# Patient Record
Sex: Female | Born: 1990 | Race: Black or African American | Hispanic: No | Marital: Single | Smoking: Never smoker
Health system: Southern US, Community
[De-identification: ages and names within clinical notes are randomized; demographics above are authoritative.]

## PROBLEM LIST (undated history)

## (undated) DIAGNOSIS — F329 Major depressive disorder, single episode, unspecified: Secondary | ICD-10-CM

## (undated) DIAGNOSIS — N809 Endometriosis, unspecified: Secondary | ICD-10-CM

## (undated) DIAGNOSIS — A599 Trichomoniasis, unspecified: Secondary | ICD-10-CM

## (undated) DIAGNOSIS — F419 Anxiety disorder, unspecified: Secondary | ICD-10-CM

## (undated) DIAGNOSIS — J45909 Unspecified asthma, uncomplicated: Secondary | ICD-10-CM

## (undated) DIAGNOSIS — F32A Depression, unspecified: Secondary | ICD-10-CM

## (undated) HISTORY — DX: Unspecified asthma, uncomplicated: J45.909

## (undated) HISTORY — PX: WISDOM TOOTH EXTRACTION: SHX21

## (undated) HISTORY — DX: Anxiety disorder, unspecified: F41.9

## (undated) HISTORY — DX: Major depressive disorder, single episode, unspecified: F32.9

## (undated) HISTORY — DX: Depression, unspecified: F32.A

## (undated) HISTORY — PX: TONSILLECTOMY AND ADENOIDECTOMY: SUR1326

## (undated) HISTORY — DX: Trichomoniasis, unspecified: A59.9

---

## 2016-05-19 ENCOUNTER — Encounter (HOSPITAL_COMMUNITY): Payer: Self-pay | Admitting: Emergency Medicine

## 2016-05-19 ENCOUNTER — Ambulatory Visit (HOSPITAL_COMMUNITY)
Admission: EM | Admit: 2016-05-19 | Discharge: 2016-05-19 | Disposition: A | Discharge status: Home / Self Care | Payer: BLUE CROSS/BLUE SHIELD | Attending: Family Medicine | Admitting: Family Medicine

## 2016-05-19 DIAGNOSIS — A599 Trichomoniasis, unspecified: Secondary | ICD-10-CM | POA: Diagnosis not present

## 2016-05-19 DIAGNOSIS — Z888 Allergy status to other drugs, medicaments and biological substances status: Secondary | ICD-10-CM | POA: Insufficient documentation

## 2016-05-19 DIAGNOSIS — Z91040 Latex allergy status: Secondary | ICD-10-CM | POA: Diagnosis not present

## 2016-05-19 DIAGNOSIS — N760 Acute vaginitis: Secondary | ICD-10-CM | POA: Insufficient documentation

## 2016-05-19 DIAGNOSIS — N898 Other specified noninflammatory disorders of vagina: Secondary | ICD-10-CM | POA: Diagnosis present

## 2016-05-19 DIAGNOSIS — Z880 Allergy status to penicillin: Secondary | ICD-10-CM | POA: Diagnosis not present

## 2016-05-19 HISTORY — DX: Endometriosis, unspecified: N80.9

## 2016-05-19 MED ORDER — METRONIDAZOLE 500 MG PO TABS
2000.0000 mg | ORAL_TABLET | Freq: Once | ORAL | 1 refills | Status: AC
Start: 2016-05-19 — End: 2016-05-19

## 2016-05-19 NOTE — Discharge Instructions (Signed)
We are treating you for trichomoniasis and will follow the results of your labs for other causes of your symptoms. Please have your partner tested and treated as well. If these are abnormal we will call you with those results and call in the appropriate treatment. If you experience worsening abdominal pain, nausea, vomiting or fever, please proceed to medical care.

## 2016-05-19 NOTE — ED Provider Notes (Signed)
[facility]    CSN: [id number] Arrival date & time: 05/19/16  1308  First Provider Contact:  First MD Initiated Contact with Patient 05/19/16 1415    History   Chief Complaint Chief Complaint  Patient presents with  . Vaginal Discharge   HPI Kristin Weber is a 25 y.o. female presenting for vaginal discharge and irritation. She reports malodorous discharge x3 days since the conclusion of LMP associated with irritation consistent with a case of trich which was diagnosed and treated ~ 1.5 years ago. She has tried no interventions. She's sexually active with a single female partner who is exhibiting no symptoms. She's also had chlamydia treated in the past. Denies fevers, abd pain, N/V, dysuria, change in urination. LMP 8/1. No recent abx.   Past Medical History:  Diagnosis Date  . Endometriosis     There are no active problems to display for this patient.  No past surgical history on file.  OB History    No data available     Home Medications    Prior to Admission medications   Medication Sig Start Date End Date Taking? Authorizing Provider  metroNIDAZOLE (FLAGYL) 500 MG tablet Take 4 tablets (2,000 mg total) by mouth once. 05/19/16 05/19/16  Tyrone Nine, MD   Family History No family history on file.  Social History Social History  Substance Use Topics  . Smoking status: Never Smoker  . Smokeless tobacco: Never Used  . Alcohol use Yes    Allergies   Amoxicillin; Latex; and Sudafed [pseudoephedrine hcl]   Review of Systems Review of Systems As above  Physical Exam Triage Vital Signs ED Triage Vitals [05/19/16 1417]  Enc Vitals Group     BP 106/70     Pulse Rate (!) 59     Resp 18     Temp 98.2 F (36.8 C)     Temp Source Oral     SpO2 100 %     Weight      Height      Head Circumference      Peak Flow      Pain Score      Pain Loc      Pain Edu?      Excl. in GC?    No data found.   Updated Vital Signs BP 106/70 (BP Location: Left Arm)    Pulse (!) 59   Temp 98.2 F (36.8 C) (Oral)   Resp 18   LMP 05/13/2016   SpO2 100%   Physical Exam  Constitutional: She appears well-developed and well-nourished. No distress.  Eyes: Conjunctivae are normal.  Genitourinary:  Genitourinary Comments: Pelvic: Normal external female genitalia without lesions. Clitoral hood piercing present. Vaginal mucosa and cervix normal without lesions, discharge or bleeding noted on speculum exam. No cervical motion tenderness. Theresa Mulligan, RN present throughout duration of exam.  Skin: No rash noted.   UC Treatments / Results  Labs (all labs ordered are listed, but only abnormal results are displayed) Labs Reviewed  CERVICOVAGINAL ANCILLARY ONLY    EKG  EKG Interpretation None      Radiology No results found.  Procedures Procedures (including critical care time)  Medications Ordered in UC Medications - No data to display   Initial Impression / Assessment and Plan / UC Course  I have reviewed the triage vital signs and the nursing notes.  Pertinent labs & imaging results that were available during my care of the patient were reviewed by me and considered  in my medical decision making (see chart for details).  Final Clinical Impressions(s) / UC Diagnoses   Final diagnoses:  Vaginitis  Trichomoniasis   25 y.o. female with single sexual partner with vaginal irritation and discharge without significant findings on exam. Will empirically treat for trichomonas and await lab results for other treatment.  - Will provide expedited partner therapy and encouraged partner to get tested.  - Follow wet prep results - Declines HIV, RPR  New Prescriptions New Prescriptions   METRONIDAZOLE (FLAGYL) 500 MG TABLET    Take 4 tablets (2,000 mg total) by mouth once.     Tyrone Nineyan B Teira Arcilla, MD 05/19/16 1450

## 2016-05-19 NOTE — ED Triage Notes (Signed)
Patient is convinced she has trich.  Patient had a history of the same and having "the same symptoms".  Patient reports vaginal discharge with a foul odor

## 2016-05-20 LAB — CERVICOVAGINAL ANCILLARY ONLY
CHLAMYDIA, DNA PROBE: NEGATIVE
NEISSERIA GONORRHEA: NEGATIVE
Wet Prep (BD Affirm): POSITIVE — AB

## 2016-05-22 ENCOUNTER — Telehealth: Payer: Self-pay | Admitting: Internal Medicine

## 2016-05-22 ENCOUNTER — Telehealth: Payer: Self-pay | Admitting: Emergency Medicine

## 2016-05-22 MED ORDER — METRONIDAZOLE 500 MG PO TABS: 500.0000 mg | ORAL_TABLET | Freq: Two times a day (BID) | ORAL | 0 refills | Status: DC

## 2016-05-22 MED ORDER — FLUCONAZOLE 150 MG PO TABS: 150.0000 mg | ORAL_TABLET | Freq: Every day | ORAL | 0 refills | Status: DC

## 2016-05-22 NOTE — Telephone Encounter (Signed)
Called pt and notified of recent lab results from visit 8/7 Pt ID'd properly... Reports feeling better and sx have subsided but would like for ust to call in Diflucan just in case since we called in Flagyl... Per Kristin Rasmussenavid Mabe, NP ok to call in... Diflucan sent to [facility] ([address])  Adv pt if sx are not getting better to return  Pt verb understanding Education on safe sex given  Per Dr. Dayton ScrapeMurray,   Clinical staff, please let patient know that test for gardnerella (bacterial vaginosis) was positive.  Rx metronidazole was sent to the pharmacy of record, [facility] [address].  Recheck for further evaluation if symptoms persist. LM

## 2016-05-22 NOTE — Telephone Encounter (Signed)
Clinical staff, please let patient know that test for gardnerella (bacterial vaginosis) was positive.  Rx metronidazole was sent to the pharmacy of record, [facility] at [address].  Recheck for further evaluation if symptoms persist.  LM

## 2016-06-05 ENCOUNTER — Encounter: Payer: Self-pay | Admitting: Internal Medicine

## 2016-06-11 NOTE — Telephone Encounter (Signed)
See phone note from 8/10.

## 2016-07-18 ENCOUNTER — Ambulatory Visit (INDEPENDENT_AMBULATORY_CARE_PROVIDER_SITE_OTHER): Payer: BLUE CROSS/BLUE SHIELD | Admitting: Internal Medicine

## 2016-07-18 ENCOUNTER — Other Ambulatory Visit (INDEPENDENT_AMBULATORY_CARE_PROVIDER_SITE_OTHER): Payer: BLUE CROSS/BLUE SHIELD

## 2016-07-18 ENCOUNTER — Other Ambulatory Visit: Payer: BLUE CROSS/BLUE SHIELD

## 2016-07-18 ENCOUNTER — Encounter: Payer: Self-pay | Admitting: Internal Medicine

## 2016-07-18 VITALS — BP 100/58 | HR 68 | Ht 65.0 in | Wt 128.1 lb

## 2016-07-18 DIAGNOSIS — K921 Melena: Secondary | ICD-10-CM | POA: Diagnosis not present

## 2016-07-18 DIAGNOSIS — R1032 Left lower quadrant pain: Secondary | ICD-10-CM | POA: Diagnosis not present

## 2016-07-18 LAB — CBC WITH DIFFERENTIAL/PLATELET
BASOS ABS: 0 10*3/uL (ref 0.0–0.1)
BASOS PCT: 0.4 % (ref 0.0–3.0)
EOS ABS: 0.1 10*3/uL (ref 0.0–0.7)
Eosinophils Relative: 2.1 % (ref 0.0–5.0)
HCT: 38.8 % (ref 36.0–46.0)
HEMOGLOBIN: 13.2 g/dL (ref 12.0–15.0)
LYMPHS PCT: 38.3 % (ref 12.0–46.0)
Lymphs Abs: 2.5 10*3/uL (ref 0.7–4.0)
MCHC: 33.9 g/dL (ref 30.0–36.0)
MCV: 89.6 fl (ref 78.0–100.0)
MONOS PCT: 7.7 % (ref 3.0–12.0)
Monocytes Absolute: 0.5 10*3/uL (ref 0.1–1.0)
NEUTROS PCT: 51.5 % (ref 43.0–77.0)
Neutro Abs: 3.4 10*3/uL (ref 1.4–7.7)
PLATELETS: 292 10*3/uL (ref 150.0–400.0)
RBC: 4.33 Mil/uL (ref 3.87–5.11)
RDW: 12.9 % (ref 11.5–15.5)
WBC: 6.5 10*3/uL (ref 4.0–10.5)

## 2016-07-18 LAB — HCG, QUANTITATIVE, PREGNANCY: QUANTITATIVE HCG: 0.2 m[IU]/mL

## 2016-07-18 NOTE — Progress Notes (Signed)
Kristin Weber 25 y.o. 29-Aug-1991 [id number]  Assessment & Plan:   Encounter Diagnoses  Name Primary?  . Blood in stool Yes  . LLQ pain     Colonoscopy The risks and benefits as well as alternatives of endoscopic procedure(s) have been discussed and reviewed. All questions answered. The patient agrees to proceed.  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 It seems to be somewhat worse, she will of intermittent passage of blood into the toilet. She will occasionally miss bowel movements. Does not strain too much to stool. She says she's been told she has hemorrhoids. She doesn't notice any protrusion her prolapse symptoms or other symptoms of hemorrhoids and there is no pain in the rectum with defecation. Interestingly overnight she awakened with severe left lower quadrant pain and had some diarrhea that was bloody 2. That has abated. She feels fine this morning. She wonders if she might be pregnant. She says she has a history of endometriosis diagnosed by ultrasound. I don't think she's never had a laparoscopy.  Allergies  Allergen Reactions  . Amoxicillin   . Latex   . Sudafed [Pseudoephedrine Hcl]    Outpatient Medications Prior to Visit  Medication Sig Dispense Refill  . fluconazole (DIFLUCAN) 150 MG tablet Take 1 tablet (150 mg total) by mouth daily. Take additional dose in 3 days if symptoms are not getting better. 2 tablet 0  . metroNIDAZOLE (FLAGYL) 500 MG tablet Take 1 tablet (500 mg total) by mouth 2 (two) times daily. 14 tablet 0   No facility-administered medications prior to visit.    Past Medical History:  Diagnosis Date  . Anxiety   . Asthma   . Depression   . Endometriosis   . Trichimoniasis    Past Surgical History:  Procedure Laterality Date  .  TONSILLECTOMY AND ADENOIDECTOMY     Social History   Social History  . Marital status: Single    Spouse name: N/A  . Number of children: 0  . Years of education: N/A   Occupational History  . [employer]    Social History Main Topics  . Smoking status: Never Smoker  . Smokeless tobacco: Never Used  . Alcohol use Yes     Comment: twice a month  . Drug use: No  . Sexual activity: Not Asked   Other Topics Concern  . 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 Breast cancer Other     PGGM, MGGM  . Ovarian cancer Other     PGGM, MGGM  . Diabetes Maternal Grandmother   . Irritable bowel syndrome Maternal Grandmother   . Diabetes Paternal Grandmother   . Irritable bowel syndrome Paternal Grandmother        Review of Systems As per history of present illness. Also allergies. Some fatigue symptoms anxiety and depressed mood at times. Other review of systems are negative.  Objective:   Physical Exam @BP  (!) 100/58 (BP Location: Right Arm, Patient Position: Sitting, Cuff Size: Normal)   Pulse 68   Ht 5\' 5"  (1.651 m) Comment: height measured without shoes  Wt 128 lb 2 oz (58.1 kg)   LMP 07/13/2016   BMI 21.32 kg/m @  General:  Well-developed, well-nourished and in no acute distress Eyes:  anicteric. ENT:   Mouth and posterior pharynx free of lesions.  Neck:   supple w/o thyromegaly or mass.  Lungs: Clear to auscultation bilaterally. Heart:  S1S2, no rubs, murmurs, gallops. Abdomen:  soft, non-tender, no hepatosplenomegaly, hernia, or mass and BS+.  Rectal:  Patti [place], CMA present.  RP small fleshy tag Some anal spasm ? Mildly tender Slight blood on finger no mass  Anoscopy was performed with the patient in the left lateral decubitus position while a chaperone was present  and revealed no abnormalities but anal spasm limited exam somewhat  Lymph:  no cervical or supraclavicular adenopathy. Extremities:   no edema, cyanosis or clubbing Skin   no rash. Numerous tattoos Neuro:  A&O x 3.  Psych:  appropriate mood and  Affect.   Data Reviewed:

## 2016-07-18 NOTE — Patient Instructions (Signed)
  You have been scheduled for a colonoscopy. Please follow written instructions given to you at your visit today.  Please pick up your prep supplies at the pharmacy . If you use inhalers (even only as needed), please bring them with you on the day of your procedure. Your physician has requested that you go to www.startemmi.com and enter the access code given to you at your visit today. This web site gives a general overview about your procedure. However, you should still follow specific instructions given to you by our office regarding your preparation for the procedure.    Your physician has requested that you go to the basement for lab work before leaving today.     I appreciate the opportunity to care for you. Stan Headarl Gessner, MD, [facility]

## 2016-07-18 NOTE — Progress Notes (Signed)
My chart note to the patient negative pregnancy test normal CBC

## 2016-08-13 ENCOUNTER — Other Ambulatory Visit: Payer: Self-pay | Admitting: Obstetrics & Gynecology

## 2016-08-13 ENCOUNTER — Encounter: Payer: Self-pay | Admitting: Internal Medicine

## 2016-08-14 MED FILL — FLUCONAZOLE 150 MG TABLET: 150 | 1 days supply | Qty: 1 | Fill #0

## 2016-08-26 ENCOUNTER — Ambulatory Visit (AMBULATORY_SURGERY_CENTER): Payer: BLUE CROSS/BLUE SHIELD | Admitting: Internal Medicine

## 2016-08-26 ENCOUNTER — Telehealth: Payer: Self-pay | Admitting: Internal Medicine

## 2016-08-26 ENCOUNTER — Encounter: Payer: Self-pay | Admitting: Internal Medicine

## 2016-08-26 VITALS — BP 106/70 | HR 76 | Temp 97.8°F | Resp 14 | Ht 65.0 in | Wt 128.0 lb

## 2016-08-26 DIAGNOSIS — K648 Other hemorrhoids: Secondary | ICD-10-CM | POA: Diagnosis not present

## 2016-08-26 DIAGNOSIS — K921 Melena: Secondary | ICD-10-CM

## 2016-08-26 MED ORDER — SODIUM CHLORIDE 0.9 % IV SOLN: 500.0000 mL | INTRAVENOUS | Status: AC

## 2016-08-26 MED ORDER — HYDROCORTISONE 2.5 % RE CREA: 1.0000 "application " | TOPICAL_CREAM | Freq: Two times a day (BID) | RECTAL | 1 refills | Status: DC | PRN

## 2016-08-26 NOTE — Op Note (Signed)
[facility] Patient Name: Kristin Weber Procedure Date: 08/26/2016 3:28 PM MRN: [id number] Endoscopist: Iva Boop , MD Age: 25 Referring MD:  Date of Birth: 1991-05-24 Gender: Female Account #: [id number] Procedure:                Colonoscopy Indications:              Hematochezia Medicines:                Propofol per Anesthesia, Monitored Anesthesia Care Procedure:                Pre-Anesthesia Assessment:                           - Prior to the procedure, a History and Physical                            was performed, and patient medications and                            allergies were reviewed. The patient's tolerance of                            previous anesthesia was also reviewed. The risks                            and benefits of the procedure and the sedation                            options and risks were discussed with the patient.                            All questions were answered, and informed consent                            was obtained. Prior Anticoagulants: The patient has                            taken no previous anticoagulant or antiplatelet                            agents. ASA Grade Assessment: I - A normal, healthy                            patient. After reviewing the risks and benefits,                            the patient was deemed in satisfactory condition to                            undergo the procedure.                           After obtaining informed consent, the colonoscope  was passed under direct vision. Throughout the                            procedure, the patient's blood pressure, pulse, and                            oxygen saturations were monitored continuously. The                            Model PCF-H190L [id number](SN#[id number]) scope was introduced                            through the anus and advanced to the the cecum,                            identified by appendiceal orifice  and ileocecal                            valve. The colonoscopy was performed without                            difficulty. The patient tolerated the procedure                            well. The quality of the bowel preparation was                            excellent. The bowel preparation used was Miralax.                            The ileocecal valve, appendiceal orifice, and                            rectum were photographed. Scope In: 3:33:40 PM Scope Out: 3:44:08 PM Scope Withdrawal Time: 0 hours 7 minutes 18 seconds  Total Procedure Duration: 0 hours 10 minutes 28 seconds  Findings:                 The perianal and digital rectal examinations were                            normal.                           Internal hemorrhoids were found during retroflexion.                           The exam was otherwise without abnormality on                            direct and retroflexion views. Complications:            No immediate complications. Estimated Blood Loss:     Estimated blood loss: none. Impression:               - Internal hemorrhoids.                           -  The examination was otherwise normal on direct                            and retroflexion views.                           - No specimens collected. Recommendation:           - Patient has a contact number available for                            emergencies. The signs and symptoms of potential                            delayed complications were discussed with the                            patient. Return to normal activities tomorrow.                            Written discharge instructions were provided to the                            patient.                           - Continue present medications.                           - High fiber diet.                           - No recommendation at this time regarding repeat                            colonoscopy due to young age.                            -                           BENEFIBER 2 TBSP DAILY                           HYDROCORTISONE CREAM PRN HEMORRHOID BLEEDING                           IF THIS FAILS CONSIDER BANDING Iva Booparl E Harla Mensch, MD 08/26/2016 3:52:46 PM This report has been signed electronically.

## 2016-08-26 NOTE — Telephone Encounter (Signed)
Spoke with pt and she states she has a "chest bar" and "vaginal piercing."  They are new piercings and she doesn't want to remove them and isn't sure that she'd be able to.  I spoke with Dr. Leone PayorGessner who states it is ok to keep piercings in.

## 2016-08-26 NOTE — Progress Notes (Signed)
Report given to PACU RN, vss 

## 2016-08-26 NOTE — Patient Instructions (Addendum)
It looks like the bleeding is coming from hemorrhoids.  I recommend you take 2 tablespoons of Benefiber every day and treat them with hydrocortisone cream.  If this fails to relieve all/most of symptoms then we may consider hemorrhoid banding if you are interested.  I appreciate the opportunity to care for you. Iva Booparl E. Gessner, MD, FACG     YOU HAD AN ENDOSCOPIC PROCEDURE TODAY AT THE [facility]:   Refer to the procedure report that was given to you for any specific questions about what was found during the examination.  If the procedure report does not answer your questions, please call your gastroenterologist to clarify.  If you requested that your care partner not be given the details of your procedure findings, then the procedure report has been included in a sealed envelope for you to review at your convenience later.  YOU SHOULD EXPECT: Some feelings of bloating in the abdomen. Passage of more gas than usual.  Walking can help get rid of the air that was put into your GI tract during the procedure and reduce the bloating. If you had a lower endoscopy (such as a colonoscopy or flexible sigmoidoscopy) you may notice spotting of blood in your stool or on the toilet paper. If you underwent a bowel prep for your procedure, you may not have a normal bowel movement for a few days.  Please Note:  You might notice some irritation and congestion in your nose or some drainage.  This is from the oxygen used during your procedure.  There is no need for concern and it should clear up in a day or so.  SYMPTOMS TO REPORT IMMEDIATELY:   Following lower endoscopy (colonoscopy or flexible sigmoidoscopy):  Excessive amounts of blood in the stool  Significant tenderness or worsening of abdominal pains  Swelling of the abdomen that is new, acute  Fever of 100F or higher   Following upper endoscopy (EGD)  Vomiting of blood or coffee ground material  New chest pain or pain under  the shoulder blades  Painful or persistently difficult swallowing  New shortness of breath  Fever of 100F or higher  Black, tarry-looking stools  For urgent or emergent issues, a gastroenterologist can be reached at any hour by calling [phone].   DIET:  We do recommend a small meal at first, but then you may proceed to your regular diet.  Drink plenty of fluids but you should avoid alcoholic beverages for 24 hours.  ACTIVITY:  You should plan to take it easy for the rest of today and you should NOT DRIVE or use heavy machinery until tomorrow (because of the sedation medicines used during the test).    FOLLOW UP: Our staff will call the number listed on your records the next business day following your procedure to check on you and address any questions or concerns that you may have regarding the information given to you following your procedure. If we do not reach you, we will leave a message.  However, if you are feeling well and you are not experiencing any problems, there is no need to return our call.  We will assume that you have returned to your regular daily activities without incident.  If any biopsies were taken you will be contacted by phone or by letter within the next 1-3 weeks.  Please call us at [phone] if you have not heard about the biopsies in 3 weeks.    SIGNATURES/CONFIDENTIALITY: You and/or your care  partner have signed paperwork which will be entered into your electronic medical record.  These signatures attest to the fact that that the information above on your After Visit Summary has been reviewed and is understood.  Full responsibility of the confidentiality of this discharge information lies with you and/or your care-partner.   Handouts were given to your care partner on hemorrhoids, The [employer] Banding System info, and a high fiber diet with liberal fluid intake. Add Benifiber 2 TBSP Daily and rx was sent for prescription strength hydrocortisone cream  to your pharmacy by Dr. Lajean SilviusGreesner. If the cream does not help consider banding of hemorrhoids. You may resume your current medications today. Please call if any questions or concerns.

## 2016-08-26 NOTE — Progress Notes (Signed)
Per Dr. Leone PayorGessner he sent rx for hydrocortisone cream to [facility].  No problems noted in the recovery room. maw

## 2016-08-27 ENCOUNTER — Telehealth: Payer: Self-pay

## 2016-08-27 NOTE — Telephone Encounter (Signed)
Attempted to reach pt. For follow up call following procedure yesterday.   Number pt. Provided is no longer in service.

## 2016-09-15 MED FILL — FLUCONAZOLE 150 MG TABLET: 150 | 1 days supply | Qty: 1 | Fill #1

## 2016-11-03 ENCOUNTER — Ambulatory Visit (HOSPITAL_COMMUNITY)
Admission: EM | Admit: 2016-11-03 | Discharge: 2016-11-03 | Disposition: A | Discharge status: Home / Self Care | Payer: BLUE CROSS/BLUE SHIELD | Attending: Family Medicine | Admitting: Family Medicine

## 2016-11-03 ENCOUNTER — Encounter (HOSPITAL_COMMUNITY): Payer: Self-pay | Admitting: Emergency Medicine

## 2016-11-03 DIAGNOSIS — B373 Candidiasis of vulva and vagina: Secondary | ICD-10-CM | POA: Diagnosis not present

## 2016-11-03 DIAGNOSIS — B3731 Acute candidiasis of vulva and vagina: Secondary | ICD-10-CM

## 2016-11-03 DIAGNOSIS — N898 Other specified noninflammatory disorders of vagina: Secondary | ICD-10-CM | POA: Diagnosis present

## 2016-11-03 MED ORDER — TERCONAZOLE 80 MG VA SUPP: 80.0000 mg | Freq: Every day | VAGINAL | 0 refills | Status: DC

## 2016-11-03 MED ORDER — FLUCONAZOLE 150 MG PO TABS: 150.0000 mg | ORAL_TABLET | Freq: Once | ORAL | 1 refills | Status: AC

## 2016-11-03 NOTE — Discharge Instructions (Signed)
We will call with positive test results and treat as indicated  °

## 2016-11-03 NOTE — ED Provider Notes (Signed)
[facility]    CSN: [id number] Arrival date & time: 11/03/16  1657     History   Chief Complaint Chief Complaint  Patient presents with  . Vaginal Discharge    HPI Kristin Weber is a 26 y.o. female.   The history is provided by the patient.  Vaginal Discharge  Quality:  Green, thick and white Severity:  Mild Onset quality:  Gradual Duration:  1 week Progression:  Worsening Chronicity:  Recurrent (h/o mult yeast infection problems.) Context comment:  Pt in lesbian marriage. Relieved by:  None tried Worsened by:  Nothing Associated symptoms: no dysuria     Past Medical History:  Diagnosis Date  . Anxiety   . Asthma   . Depression   . Endometriosis   . Trichimoniasis     There are no active problems to display for this patient.   Past Surgical History:  Procedure Laterality Date  . TONSILLECTOMY AND ADENOIDECTOMY    . WISDOM TOOTH EXTRACTION      OB History    No data available       Home Medications    Prior to Admission medications   Medication Sig Start Date End Date Taking? Authorizing Provider  metroNIDAZOLE (FLAGYL) 500 MG tablet Take 1 tablet (500 mg total) by mouth 2 (two) times daily. 11/09/16   Linna Hoff, MD  terconazole (TERAZOL 3) 80 MG vaginal suppository Place 1 suppository (80 mg total) vaginally at bedtime. 11/09/16   Linna Hoff, MD    Family History Family History  Problem Relation Age of Onset  . Breast cancer Other     PGGM, MGGM  . Ovarian cancer Other     PGGM, MGGM  . Diabetes Maternal Grandmother   . Irritable bowel syndrome Maternal Grandmother   . Diabetes Paternal Grandmother   . Irritable bowel syndrome Paternal Grandmother   . Colon cancer Paternal Grandfather     Social History Social History  Substance Use Topics  . Smoking status: Never Smoker  . Smokeless tobacco: Never Used  . Alcohol use 0.6 oz/week    1 Glasses of wine per week     Comment: twice a month     Allergies     Amoxicillin; Latex; and Sudafed [pseudoephedrine hcl]   Review of Systems Review of Systems  Constitutional: Negative.   Gastrointestinal: Negative.   Genitourinary: Positive for vaginal discharge. Negative for difficulty urinating, dysuria, frequency and menstrual problem.  All other systems reviewed and are negative.    Physical Exam Triage Vital Signs ED Triage Vitals  Enc Vitals Group     BP 11/03/16 1747 128/72     Pulse Rate 11/03/16 1747 65     Resp 11/03/16 1747 16     Temp 11/03/16 1747 99 F (37.2 C)     Temp Source 11/03/16 1747 Oral     SpO2 11/03/16 1747 100 %     Weight --      Height --      Head Circumference --      Peak Flow --      Pain Score 11/03/16 1749 0     Pain Loc --      Pain Edu? --      Excl. in GC? --    No data found.   Updated Vital Signs BP 128/72 (BP Location: Left Arm)   Pulse 65   Temp 99 F (37.2 C) (Oral)   Resp 16   LMP 10/29/2016 (Exact Date)  SpO2 100%   Visual Acuity Right Eye Distance:   Left Eye Distance:   Bilateral Distance:    Right Eye Near:   Left Eye Near:    Bilateral Near:     Physical Exam  Constitutional: She is oriented to person, place, and time. She appears well-developed and well-nourished. No distress.  Abdominal: Soft. Bowel sounds are normal. She exhibits no distension. There is no tenderness.  Neurological: She is alert and oriented to person, place, and time.  Skin: Skin is warm.     UC Treatments / Results  Labs (all labs ordered are listed, but only abnormal results are displayed) Labs Reviewed  URINE CYTOLOGY ANCILLARY ONLY - Abnormal; Notable for the following:       Result Value   Bacterial vaginitis   (*)    Value: **POSITIVE for Atopobium vaginae POSITIVE for Gardnerella vaginalis**   All other components within normal limits  HIV ANTIBODY (ROUTINE TESTING)  RPR  URINE CYTOLOGY ANCILLARY ONLY    EKG  EKG Interpretation None       Radiology No results  found.  Procedures Procedures (including critical care time)  Medications Ordered in UC Medications - No data to display   Initial Impression / Assessment and Plan / UC Course  I have reviewed the triage vital signs and the nursing notes.  Pertinent labs & imaging results that were available during my care of the patient were reviewed by me and considered in my medical decision making (see chart for details).       Final Clinical Impressions(s) / UC Diagnoses   Final diagnoses:  Yeast vaginitis    New Prescriptions Discharge Medication List as of 11/03/2016  6:33 PM    START taking these medications   Details  fluconazole (DIFLUCAN) 150 MG tablet Take 1 tablet (150 mg total) by mouth once. May repeat in 1 week., Starting Mon 11/03/2016, Print    terconazole (TERAZOL 3) 80 MG vaginal suppository Place 1 suppository (80 mg total) vaginally at bedtime., Starting Mon 11/03/2016, Print         Linna HoffJames D Kasiya Burck, MD 11/12/16 2114

## 2016-11-03 NOTE — ED Triage Notes (Signed)
[facility] a complaint of a vaginal discharge x 1 week. The patient denied any low back pain, abdominal pain or dysuria.

## 2016-11-04 LAB — URINE CYTOLOGY ANCILLARY ONLY
CHLAMYDIA, DNA PROBE: NEGATIVE
Neisseria Gonorrhea: NEGATIVE
TRICH (WINDOWPATH): NEGATIVE

## 2016-11-04 LAB — RPR: RPR: NONREACTIVE

## 2016-11-04 LAB — HIV ANTIBODY (ROUTINE TESTING W REFLEX): HIV Screen 4th Generation wRfx: NONREACTIVE

## 2016-11-04 MED FILL — FLUCONAZOLE 150 MG TABLET: 150 | 1 days supply | Qty: 1 | Fill #0

## 2016-11-04 MED FILL — TERCONAZOLE 80 MG SUPP: 80 | 3 days supply | Qty: 3 | Fill #0

## 2016-11-05 LAB — URINE CYTOLOGY ANCILLARY ONLY
Bacterial vaginitis: POSITIVE — AB
Candida vaginitis: NEGATIVE

## 2016-11-09 ENCOUNTER — Telehealth (HOSPITAL_COMMUNITY): Payer: Self-pay | Admitting: Emergency Medicine

## 2016-11-09 MED ORDER — METRONIDAZOLE 500 MG PO TABS: 500.0000 mg | ORAL_TABLET | Freq: Two times a day (BID) | ORAL | 1 refills | Status: AC

## 2016-11-09 MED ORDER — TERCONAZOLE 80 MG VA SUPP: 80.0000 mg | Freq: Every day | VAGINAL | 0 refills | Status: AC

## 2016-11-09 NOTE — Telephone Encounter (Signed)
Patient called and requested prescriptions be re-sent to [facility] on [address].

## 2016-12-12 MED FILL — FLUCONAZOLE 150 MG TABLET: 150 | 3 days supply | Qty: 2 | Fill #0

## 2017-01-12 ENCOUNTER — Other Ambulatory Visit: Payer: Self-pay | Admitting: Obstetrics & Gynecology

## 2017-01-12 MED FILL — metFORMIN HCL 500 MG TABS: 500 | 21 days supply | Qty: 42 | Fill #0

## 2017-02-03 MED FILL — FLUCONAZOLE 150 MG TABLET: 150 | 6 days supply | Qty: 2 | Fill #0

## 2017-02-17 MED FILL — PRENA1 PEARL SOFTGEL: 30-1.4-200 | 21 days supply | Qty: 21 | Fill #0

## 2017-02-18 MED FILL — MEDROXYPROGESTERONE 10 MG T: 10 | 10 days supply | Qty: 10 | Fill #0

## 2017-03-20 MED FILL — CLOMIPHENE CITRATE 50 MG TA: 50 | 5 days supply | Qty: 5 | Fill #0

## 2017-04-16 MED FILL — CLOMIPHENE CITRATE 50 MG TA: 50 | 5 days supply | Qty: 5 | Fill #1

## 2017-04-22 ENCOUNTER — Emergency Department
Admission: EM | Admit: 2017-04-22 | Discharge: 2017-04-22 | Disposition: A | Discharge status: Home / Self Care | Payer: BLUE CROSS/BLUE SHIELD | Attending: Emergency Medicine | Admitting: Emergency Medicine

## 2017-04-22 ENCOUNTER — Emergency Department: Payer: BLUE CROSS/BLUE SHIELD

## 2017-04-22 DIAGNOSIS — K2900 Acute gastritis without bleeding: Secondary | ICD-10-CM

## 2017-04-22 DIAGNOSIS — J45909 Unspecified asthma, uncomplicated: Secondary | ICD-10-CM | POA: Insufficient documentation

## 2017-04-22 DIAGNOSIS — Z91040 Latex allergy status: Secondary | ICD-10-CM | POA: Diagnosis not present

## 2017-04-22 DIAGNOSIS — K219 Gastro-esophageal reflux disease without esophagitis: Secondary | ICD-10-CM | POA: Diagnosis not present

## 2017-04-22 DIAGNOSIS — R079 Chest pain, unspecified: Secondary | ICD-10-CM | POA: Diagnosis present

## 2017-04-22 LAB — CBC
HEMATOCRIT: 38.4 % (ref 35.0–47.0)
Hemoglobin: 13.1 g/dL (ref 12.0–16.0)
MCH: 30.8 pg (ref 26.0–34.0)
MCHC: 34.2 g/dL (ref 32.0–36.0)
MCV: 90.1 fL (ref 80.0–100.0)
PLATELETS: 256 10*3/uL (ref 150–440)
RBC: 4.26 MIL/uL (ref 3.80–5.20)
RDW: 12.7 % (ref 11.5–14.5)
WBC: 6.7 10*3/uL (ref 3.6–11.0)

## 2017-04-22 LAB — BASIC METABOLIC PANEL
Anion gap: 10 (ref 5–15)
BUN: 7 mg/dL (ref 6–20)
CO2: 25 mmol/L (ref 22–32)
Calcium: 9.4 mg/dL (ref 8.9–10.3)
Chloride: 104 mmol/L (ref 101–111)
Creatinine, Ser: 0.74 mg/dL (ref 0.44–1.00)
Glucose, Bld: 82 mg/dL (ref 65–99)
POTASSIUM: 3.9 mmol/L (ref 3.5–5.1)
SODIUM: 139 mmol/L (ref 135–145)

## 2017-04-22 LAB — HCG, QUANTITATIVE, PREGNANCY

## 2017-04-22 LAB — URINALYSIS, COMPLETE (UACMP) WITH MICROSCOPIC
Bacteria, UA: NONE SEEN
Bilirubin Urine: NEGATIVE
Glucose, UA: NEGATIVE mg/dL
Hgb urine dipstick: NEGATIVE
Ketones, ur: 5 mg/dL — AB
LEUKOCYTES UA: NEGATIVE
Nitrite: NEGATIVE
PH: 7 (ref 5.0–8.0)
Protein, ur: NEGATIVE mg/dL
RBC / HPF: NONE SEEN RBC/hpf (ref 0–5)
SPECIFIC GRAVITY, URINE: 1.015 (ref 1.005–1.030)

## 2017-04-22 LAB — POCT PREGNANCY, URINE: PREG TEST UR: NEGATIVE

## 2017-04-22 LAB — TROPONIN I: Troponin I: <0.03 ng/mL (ref <0.03)

## 2017-04-22 MED ORDER — DICYCLOMINE HCL 10 MG PO CAPS: 10.0000 mg | ORAL_CAPSULE | Freq: Once | ORAL | Status: DC

## 2017-04-22 MED ORDER — SUCRALFATE 1 G PO TABS: 1.0000 g | ORAL_TABLET | Freq: Once | ORAL | Status: DC

## 2017-04-22 MED ORDER — GI COCKTAIL ~~LOC~~
30.0000 mL | Freq: Once | ORAL | Status: AC
  Administered 2017-04-22: 30 mL via ORAL
  Filled 2017-04-22: qty 30

## 2017-04-22 MED ORDER — FAMOTIDINE 40 MG PO TABS: 40.0000 mg | ORAL_TABLET | Freq: Every evening | ORAL | 0 refills | Status: AC

## 2017-04-22 MED ORDER — FAMOTIDINE 20 MG PO TABS
40.0000 mg | ORAL_TABLET | Freq: Once | ORAL | Status: AC
  Administered 2017-04-22: 40 mg via ORAL
  Filled 2017-04-22: qty 2

## 2017-04-22 NOTE — ED Provider Notes (Signed)
[facility] Emergency Department Provider Note  ____________________________________________   First MD Initiated Contact with Patient 04/22/17 1406     (approximate)  I have reviewed the triage vital signs and the nursing notes.   HISTORY  Chief Complaint No chief complaint on file.   HPI Kristin Weber is a 26 y.o. female who is presenting to the emergency department today with 4 out of 10 chest pain which she describes as a burning sensation. She says it has been constant over the past 24 hours worse when she lies back or burps. She is also complaining of left upper quadrant abdominal pain. Says that she has been taking Klonopin over the past several days in an effort to ovulate to become pregnant. Says that she has been nauseous but has not vomited. Says that food also makes the pain worse that the pain radiates through to the back. Does not report any shortness of breath. Does not report any diaphoresis. Says that she had tried Tums at home without any relief.   Past Medical History:  Diagnosis Date  . Anxiety   . Asthma   . Depression   . Endometriosis   . Trichimoniasis     There are no active problems to display for this patient.   Past Surgical History:  Procedure Laterality Date  . TONSILLECTOMY AND ADENOIDECTOMY    . WISDOM TOOTH EXTRACTION      Prior to Admission medications   Medication Sig Start Date End Date Taking? Authorizing Provider  metroNIDAZOLE (FLAGYL) 500 MG tablet Take 1 tablet (500 mg total) by mouth 2 (two) times daily. 11/09/16   Linna HoffKindl, James D, MD  terconazole (TERAZOL 3) 80 MG vaginal suppository Place 1 suppository (80 mg total) vaginally at bedtime. 11/09/16   Linna HoffKindl, James D, MD    Allergies Amoxicillin; Latex; and Sudafed [pseudoephedrine hcl]  Family History  Problem Relation Age of Onset  . Breast cancer Other        PGGM, MGGM  . Ovarian cancer Other        PGGM, MGGM  . Diabetes Maternal Grandmother     . Irritable bowel syndrome Maternal Grandmother   . Diabetes Paternal Grandmother   . Irritable bowel syndrome Paternal Grandmother   . Colon cancer Paternal Grandfather     Social History Social History  Substance Use Topics  . Smoking status: Never Smoker  . Smokeless tobacco: Never Used  . Alcohol use 0.6 oz/week    1 Glasses of wine per week     Comment: twice a month    Review of Systems  Constitutional: No fever/chills Eyes: No visual changes. ENT: No sore throat. Cardiovascular:as above Respiratory: Denies shortness of breath. Gastrointestinal:  no vomiting.  No diarrhea.  No constipation. Genitourinary: Negative for dysuria. Musculoskeletal: Negative for back pain. Skin: Negative for rash. Neurological: Negative for headaches, focal weakness or numbness.   ____________________________________________   PHYSICAL EXAM:  VITAL SIGNS: ED Triage Vitals  Enc Vitals Group     BP 04/22/17 1417 118/70     Pulse Rate 04/22/17 1417 63     Resp 04/22/17 1417 18     Temp 04/22/17 1417 98 F (36.7 C)     Temp Source 04/22/17 1417 Oral     SpO2 04/22/17 1417 100 %     Weight 04/22/17 1223 150 lb (68 kg)     Height --      Head Circumference --      Peak Flow --  Pain Score 04/22/17 1238 5     Pain Loc --      Pain Edu? --      Excl. in GC? --     Constitutional: Alert and oriented. Well appearing and in no acute distress. Eyes: Conjunctivae are normal.  Head: Atraumatic. Nose: No congestion/rhinnorhea. Mouth/Throat: Mucous membranes are moist.  Neck: No stridor.   Cardiovascular: Normal rate, regular rhythm. Grossly normal heart sounds.  Respiratory: Normal respiratory effort.  No retractions. Lungs CTAB. Gastrointestinal: Soft With moderate left upper quadrant tenderness to palpation. No distention.  Musculoskeletal: No lower extremity tenderness nor edema.  No joint effusions. Neurologic:  Normal speech and language. No gross focal neurologic deficits  are appreciated. Skin:  Skin is warm, dry and intact. No rash noted. Psychiatric: Mood and affect are normal. Speech and behavior are normal.  ____________________________________________   LABS (all labs ordered are listed, but only abnormal results are displayed)  Labs Reviewed  URINALYSIS, COMPLETE (UACMP) WITH MICROSCOPIC - Abnormal; Notable for the following:       Result Value   Color, Urine YELLOW (*)    APPearance CLEAR (*)    Ketones, ur 5 (*)    Squamous Epithelial / LPF 0-5 (*)    All other components within normal limits  BASIC METABOLIC PANEL  CBC  TROPONIN I  HCG, QUANTITATIVE, PREGNANCY  POCT PREGNANCY, URINE  POC URINE PREG, ED   ____________________________________________  EKG  ED ECG REPORT I, Arelia Longest, the attending physician, personally viewed and interpreted this ECG.   Date: 04/22/2017  EKG Time: 1225  Rate: 69  Rhythm: normal sinus rhythm  Axis: Normal  Intervals:none  ST&T Change: Biphasic T waves in 2, aVF, V3 through V6. No previous for comparison. No ST elevation or depression.  ____________________________________________  RADIOLOGY  No acute finding on chest x-ray ____________________________________________   PROCEDURES  Procedure(s) performed: None  Procedures  Critical Care performed: No  ____________________________________________   INITIAL IMPRESSION / ASSESSMENT AND PLAN / ED COURSE  Pertinent labs & imaging results that were available during my care of the patient were reviewed by me and considered in my medical decision making (see chart for details).  ----------------------------------------- 3:06 PM on 04/22/2017 -----------------------------------------  Patient initially given a GI cocktail for presumed gastritis and esophagitis but says that it feels like her pain is now worse. She is rocking back-and-forth bed holding her chest. Given Pepcid at this time.      ----------------------------------------- 3:38 PM on 04/22/2017 -----------------------------------------  After Pepcid the patient is pain-free. I palpated her abdomen and she is nontender at this time. She'll be discharged with a prescription for Pepcid. She will hold her Clomid and follow-up with her OB/GYN.  ____________________________________________   FINAL CLINICAL IMPRESSION(S) / ED DIAGNOSES  Gastritis.  gerd    NEW MEDICATIONS STARTED DURING THIS VISIT:  New Prescriptions   No medications on file     Note:  This document was prepared using Dragon voice recognition software and may include unintentional dictation errors.     Myrna Blazer, MD 04/22/17 1539

## 2017-04-22 NOTE — ED Triage Notes (Signed)
PAtient presents with central chest pain that radiates to her abdomen that started yesterday. Off and on sharp pains with a constant pressure present. Has not been able to eat in days per patient.

## 2017-04-22 NOTE — ED Notes (Signed)
Info prior to 1219 by this Clinical research associatewriter entered in error.

## 2017-04-24 ENCOUNTER — Emergency Department: Payer: BLUE CROSS/BLUE SHIELD

## 2017-04-24 ENCOUNTER — Other Ambulatory Visit: Payer: Self-pay

## 2017-04-24 ENCOUNTER — Emergency Department
Admission: EM | Admit: 2017-04-24 | Discharge: 2017-04-24 | Disposition: A | Discharge status: Home / Self Care | Payer: BLUE CROSS/BLUE SHIELD | Attending: Emergency Medicine | Admitting: Emergency Medicine

## 2017-04-24 DIAGNOSIS — J45909 Unspecified asthma, uncomplicated: Secondary | ICD-10-CM | POA: Diagnosis not present

## 2017-04-24 DIAGNOSIS — K5900 Constipation, unspecified: Secondary | ICD-10-CM | POA: Diagnosis not present

## 2017-04-24 DIAGNOSIS — Z79899 Other long term (current) drug therapy: Secondary | ICD-10-CM | POA: Diagnosis not present

## 2017-04-24 DIAGNOSIS — K2970 Gastritis, unspecified, without bleeding: Secondary | ICD-10-CM

## 2017-04-24 DIAGNOSIS — K5909 Other constipation: Secondary | ICD-10-CM

## 2017-04-24 DIAGNOSIS — R1013 Epigastric pain: Secondary | ICD-10-CM | POA: Diagnosis present

## 2017-04-24 LAB — URINALYSIS, COMPLETE (UACMP) WITH MICROSCOPIC
Bacteria, UA: NONE SEEN
Bilirubin Urine: NEGATIVE
GLUCOSE, UA: NEGATIVE mg/dL
Hgb urine dipstick: NEGATIVE
KETONES UR: NEGATIVE mg/dL
Leukocytes, UA: NEGATIVE
Nitrite: NEGATIVE
PH: 6 (ref 5.0–8.0)
Protein, ur: NEGATIVE mg/dL
RBC / HPF: NONE SEEN RBC/hpf (ref 0–5)
SPECIFIC GRAVITY, URINE: 1.023 (ref 1.005–1.030)

## 2017-04-24 LAB — POCT PREGNANCY, URINE: Preg Test, Ur: NEGATIVE

## 2017-04-24 LAB — CBC
HCT: 38.4 % (ref 35.0–47.0)
Hemoglobin: 12.8 g/dL (ref 12.0–16.0)
MCH: 30 pg (ref 26.0–34.0)
MCHC: 33.4 g/dL (ref 32.0–36.0)
MCV: 89.9 fL (ref 80.0–100.0)
PLATELETS: 251 10*3/uL (ref 150–440)
RBC: 4.26 MIL/uL (ref 3.80–5.20)
RDW: 12.7 % (ref 11.5–14.5)
WBC: 5.3 10*3/uL (ref 3.6–11.0)

## 2017-04-24 LAB — COMPREHENSIVE METABOLIC PANEL
ALK PHOS: 40 U/L (ref 38–126)
ALT: 19 U/L (ref 14–54)
AST: 25 U/L (ref 15–41)
Albumin: 4.3 g/dL (ref 3.5–5.0)
Anion gap: 7 (ref 5–15)
BUN: 13 mg/dL (ref 6–20)
CALCIUM: 9.2 mg/dL (ref 8.9–10.3)
CHLORIDE: 106 mmol/L (ref 101–111)
CO2: 25 mmol/L (ref 22–32)
CREATININE: 0.78 mg/dL (ref 0.44–1.00)
GFR calc non Af Amer: >60 mL/min (ref >60)
GLUCOSE: 92 mg/dL (ref 65–99)
Potassium: 3.7 mmol/L (ref 3.5–5.1)
SODIUM: 138 mmol/L (ref 135–145)
Total Bilirubin: 1 mg/dL (ref 0.3–1.2)
Total Protein: 7.5 g/dL (ref 6.5–8.1)

## 2017-04-24 LAB — TROPONIN I: Troponin I: <0.03 ng/mL (ref <0.03)

## 2017-04-24 LAB — LIPASE, BLOOD: LIPASE: 29 U/L (ref 11–51)

## 2017-04-24 MED ORDER — ESOMEPRAZOLE MAGNESIUM 40 MG PO CPDR: 40.0000 mg | DELAYED_RELEASE_CAPSULE | Freq: Every day | ORAL | 0 refills | Status: AC

## 2017-04-24 MED ORDER — POLYETHYLENE GLYCOL 3350 17 G PO PACK: 17.0000 g | PACK | Freq: Every day | ORAL | 0 refills | Status: AC

## 2017-04-24 MED ORDER — PANTOPRAZOLE SODIUM 40 MG IV SOLR
40.0000 mg | Freq: Once | INTRAVENOUS | Status: AC
  Administered 2017-04-24: 40 mg via INTRAVENOUS
  Filled 2017-04-24: qty 40

## 2017-04-24 MED ORDER — SODIUM CHLORIDE 0.9 % IV BOLUS (SEPSIS)
1000.0000 mL | Freq: Once | INTRAVENOUS | Status: AC
  Administered 2017-04-24: 1000 mL via INTRAVENOUS

## 2017-04-24 NOTE — Discharge Instructions (Signed)
Take nexium daily. You can continue pepcid as needed.   Take miralax daily for constipation.   Please call Dr. Marvell FullerGessner's office for appointment. Consider endoscopy if you have persistent abdominal pain.   Return to ER if you have worse abdominal pain, nausea, vomiting, dehydration, fever.

## 2017-04-24 NOTE — ED Provider Notes (Signed)
[facility] Provider Note   CSN: [id number] Arrival date & time: 04/24/17  0901     History   Chief Complaint Chief Complaint  Patient presents with  . Abdominal Pain    HPI Kristin Weber is a 26 y.o. female history of asthma, anxiety, endometriosis, here presenting with epigastric pain, chest pain, nausea, constipation. Patient states that she's been having epigastric pain for the last 3-4 days. States that it's a burning sensation that radiates to her chest. Worse after she eats. Patient also wakes up at night from the pain. Denies any urinary symptoms and denies any lower abdominal pain. She states that she has been constipated and only small stool comes out. Patient was seen in the ED 2 days ago and had unremarkable lab work and was diagnosed with gastritis and sent home with Pepcid. Patient states that she took some Pepcid yesterday but did not help her symptoms. Patient denies any previous abdominal surgeries.   The history is provided by the patient.    Past Medical History:  Diagnosis Date  . Anxiety   . Asthma   . Depression   . Endometriosis   . Trichimoniasis     There are no active problems to display for this patient.   Past Surgical History:  Procedure Laterality Date  . TONSILLECTOMY AND ADENOIDECTOMY    . WISDOM TOOTH EXTRACTION      OB History    No data available       Home Medications    Prior to Admission medications   Medication Sig Start Date End Date Taking? Authorizing Provider  ClomiPHENE Citrate (CLOMID PO) Take 1 tablet by mouth.   Yes [provider]  famotidine (PEPCID) 40 MG tablet Take 1 tablet (40 mg total) by mouth every evening. 04/22/17 04/22/18 Yes Schaevitz, Myra Rude, MD  ibuprofen (ADVIL,MOTRIN) 200 MG tablet Take 200 mg by mouth every 6 (six) hours as needed.   Yes [provider]  loratadine (CLARITIN) 10 MG tablet Take 10 mg by mouth daily.   Yes [provider]  metroNIDAZOLE  (FLAGYL) 500 MG tablet Take 1 tablet (500 mg total) by mouth 2 (two) times daily. Patient not taking: Reported on 04/24/2017 11/09/16   Linna Hoff, MD  terconazole (TERAZOL 3) 80 MG vaginal suppository Place 1 suppository (80 mg total) vaginally at bedtime. Patient not taking: Reported on 04/24/2017 11/09/16   Linna Hoff, MD    Family History Family History  Problem Relation Age of Onset  . Breast cancer Other        PGGM, MGGM  . Ovarian cancer Other        PGGM, MGGM  . Diabetes Maternal Grandmother   . Irritable bowel syndrome Maternal Grandmother   . Diabetes Paternal Grandmother   . Irritable bowel syndrome Paternal Grandmother   . Colon cancer Paternal Grandfather     Social History Social History  Substance Use Topics  . Smoking status: Never Smoker  . Smokeless tobacco: Never Used  . Alcohol use 0.6 oz/week    1 Glasses of wine per week     Comment: twice a month     Allergies   Amoxicillin; Sudafed [pseudoephedrine hcl]; and Latex   Review of Systems Review of Systems  Gastrointestinal: Positive for abdominal pain.  All other systems reviewed and are negative.    Physical Exam Updated Vital Signs BP 106/67   Pulse 66   Temp 98.4 F (36.9 C) (Oral)   Resp 18  Wt 68 kg (150 lb)   LMP 04/16/2017 (Exact Date) Comment: negative preg test  SpO2 100%   BMI 24.96 kg/m   Physical Exam  Constitutional: She is oriented to person, place, and time. She appears well-developed.  Anxious, slightly uncomfortable   HENT:  Head: Normocephalic.  Mouth/Throat: Oropharynx is clear and moist.  Eyes: Pupils are equal, round, and reactive to light. EOM are normal.  Neck: Normal range of motion. Neck supple.  Cardiovascular: Normal rate, regular rhythm and normal heart sounds.   Pulmonary/Chest: Effort normal and breath sounds normal. No respiratory distress. She has no wheezes. She has no rales.  Abdominal: Soft. Bowel sounds are normal.  Mild epigastric  tenderness, mild RUQ tenderness, no murphy sign   Musculoskeletal: Normal range of motion.  Neurological: She is alert and oriented to person, place, and time. No cranial nerve deficit. Coordination normal.  Skin: Skin is warm.  Psychiatric: She has a normal mood and affect.  Nursing note and vitals reviewed.    ED Treatments / Results  Labs (all labs ordered are listed, but only abnormal results are displayed) Labs Reviewed  URINALYSIS, COMPLETE (UACMP) WITH MICROSCOPIC - Abnormal; Notable for the following:       Result Value   Color, Urine YELLOW (*)    APPearance CLEAR (*)    Squamous Epithelial / LPF 0-5 (*)    All other components within normal limits  LIPASE, BLOOD  COMPREHENSIVE METABOLIC PANEL  CBC  TROPONIN I  POCT PREGNANCY, URINE  POC URINE PREG, ED    EKG  EKG Interpretation  Date/Time:  Friday April 24 2017 09:07:06 EDT Ventricular Rate:  64 PR Interval:  158 QRS Duration: 82 QT Interval:  410 QTC Calculation: 422 R Axis:   26 Text Interpretation:  Normal sinus rhythm Normal ECG When compared with ECG of 22-Apr-2017 12:25, QT has lengthened Confirmed by UNCONFIRMED, DOCTOR ([id number]), editor Fredric Mare, Tammy [phone]) on 04/24/2017 9:47:31 AM       Radiology US Abdomen Complete  Result Date: 04/24/2017 CLINICAL DATA:  Acute generalized abdominal pain, nausea. EXAM: ABDOMEN ULTRASOUND COMPLETE COMPARISON:  None. FINDINGS: Gallbladder: No gallstones or wall thickening visualized. No sonographic Murphy sign noted by sonographer. Common bile duct: Diameter: 2.3 mm which is within normal limits. Liver: No focal lesion identified. Within normal limits in parenchymal echogenicity. IVC: No abnormality visualized. Pancreas: Visualized portion unremarkable. Spleen: Size and appearance within normal limits. Right Kidney: Length: 10.9 cm. Echogenicity within normal limits. No mass or hydronephrosis visualized. Left Kidney: Length: 10.8 cm. Echogenicity within normal limits. No  mass or hydronephrosis visualized. Abdominal aorta: No aneurysm visualized. Other findings: None. IMPRESSION: No abnormality seen in the abdomen. Electronically Signed   By: Lupita Raider, M.D.   On: 04/24/2017 14:12   Dg Abd Acute W/chest  Result Date: 04/24/2017 CLINICAL DATA:  Chest and epigastric pain for the past few days. EXAM: DG ABDOMEN ACUTE W/ 1V CHEST COMPARISON:  Chest x-ray 04/22/2017 FINDINGS: The upright chest x-ray is normal. Two views of the abdomen demonstrate an unremarkable bowel gas pattern. No findings for obstruction or perforation. The soft tissue shadows are maintained. No worrisome calcifications. The bony structures are normal. IMPRESSION: Unremarkable acute abdominal series. Electronically Signed   By: Rudie Meyer M.D.   On: 04/24/2017 13:14    Procedures Procedures (including critical care time)  Medications Ordered in ED Medications  sodium chloride 0.9 % bolus 1,000 mL (1,000 mLs Intravenous New Bag/Given 04/24/17 1221)  pantoprazole (PROTONIX) injection 40  mg (40 mg Intravenous Given 04/24/17 1221)     Initial Impression / Assessment and Plan / ED Course  I have reviewed the triage vital signs and the nursing notes.  Pertinent labs & imaging results that were available during my care of the patient were reviewed by me and considered in my medical decision making (see chart for details).    Kristin Weber is a 26 y.o. female here with epigastric pain, nausea. Likely gastritis vs biliary colic vs constipation. Will repeat labs, CMP, lipase, UA. Will get acute abdominal series, abdominal US. Will give IVF, protonix and reassess.    2:24 PM LFTs nl. UA nl. Xrays showed mild constipation. US showed nl gallbladder and liver. I think likely gastritis and constipation. Will add nexium in addition to pepcid. Will add miralax for constipation. She saw Dr. Leone PayorGessner, GI doctor, before. I told her to follow Weber up with Dr. Leone PayorGessner and consider getting endoscopy for  further evaluation.   Final Clinical Impressions(s) / ED Diagnoses   Final diagnoses:  None    New Prescriptions New Prescriptions   No medications on file     Charlynne PanderYao, Mikinzie Maciejewski Hsienta, MD 04/24/17 1425

## 2017-04-24 NOTE — ED Triage Notes (Signed)
Patient to ER for c/o epigastric/chest pain that started a few days ago. States she was seen here at that time, but did not have CT or US. Patient states pain worsens after eating.

## 2017-04-24 NOTE — ED Notes (Signed)
Patient updated on wait, as well as lab results. Warm blanket given.

## 2017-04-24 NOTE — ED Notes (Signed)
Lab results reviewed. Awaiting room for MD eval.  

## 2018-10-07 IMAGING — US US ABDOMEN COMPLETE
1 series · 14 of 25 positions shown · non-contrast
Comparison: None.

CLINICAL DATA: Acute generalized abdominal pain, nausea.

EXAM:
ABDOMEN ULTRASOUND COMPLETE

[Series 1: us abdomen complete · 0.11mm/px · 14 of 96 slices shown]
[im 1/96]
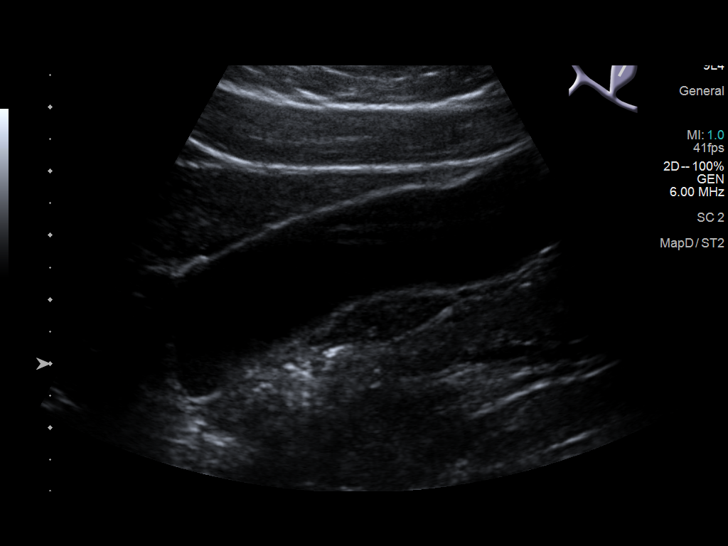
[im 8/96]
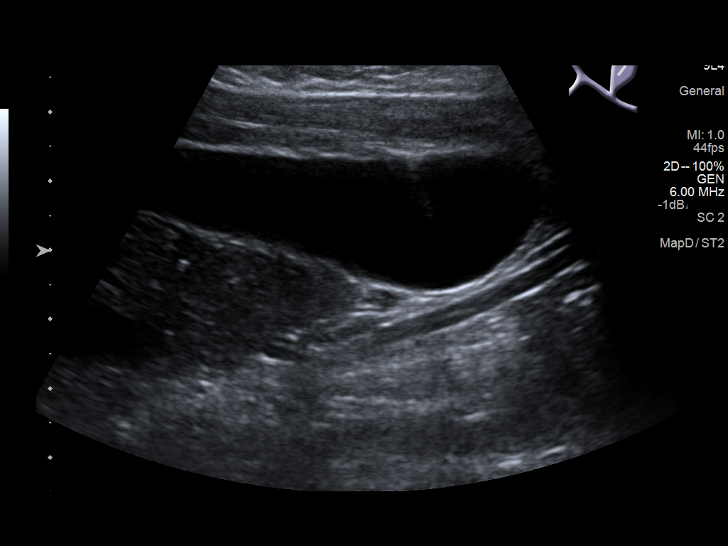
[im 16/96]
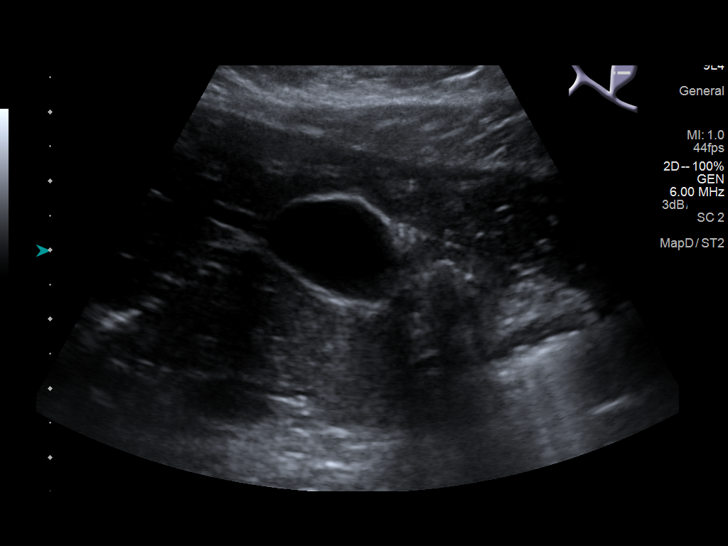
[im 24/96]
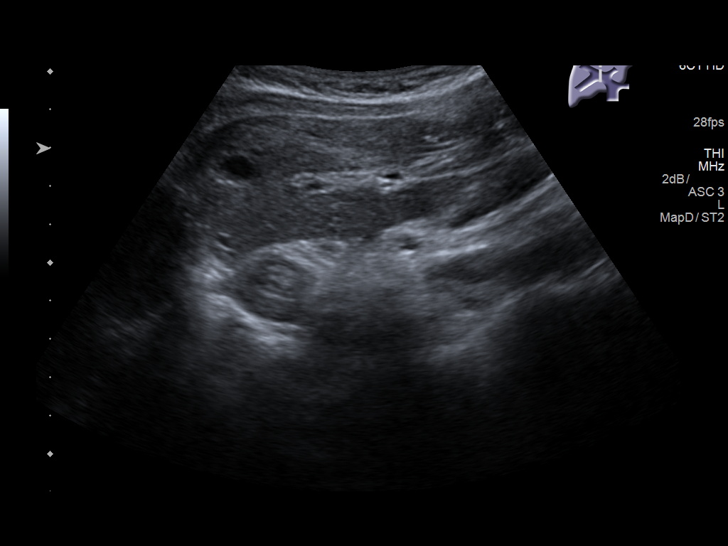
[im 32/96]
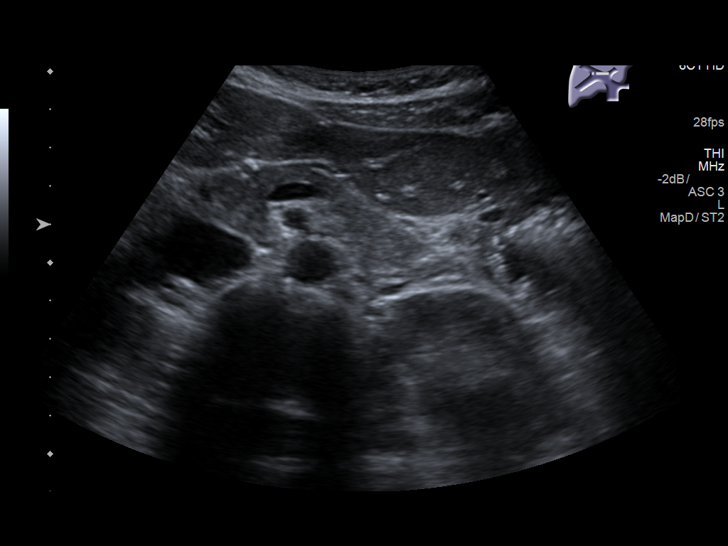
[im 36/96]
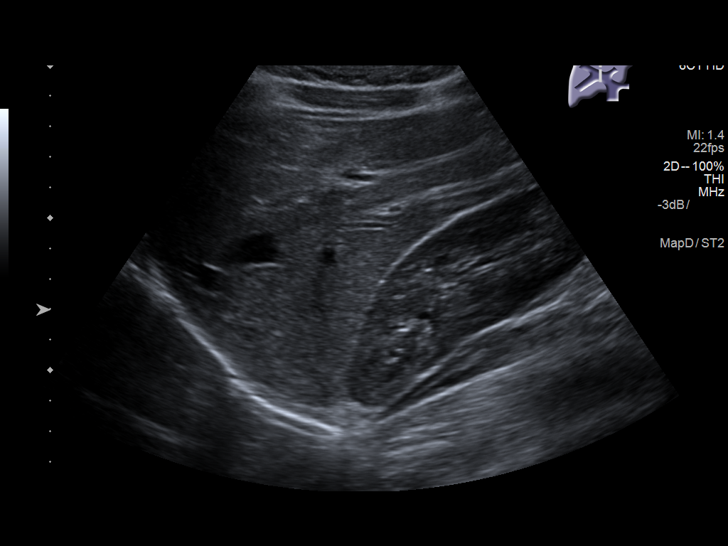
[im 44/96]
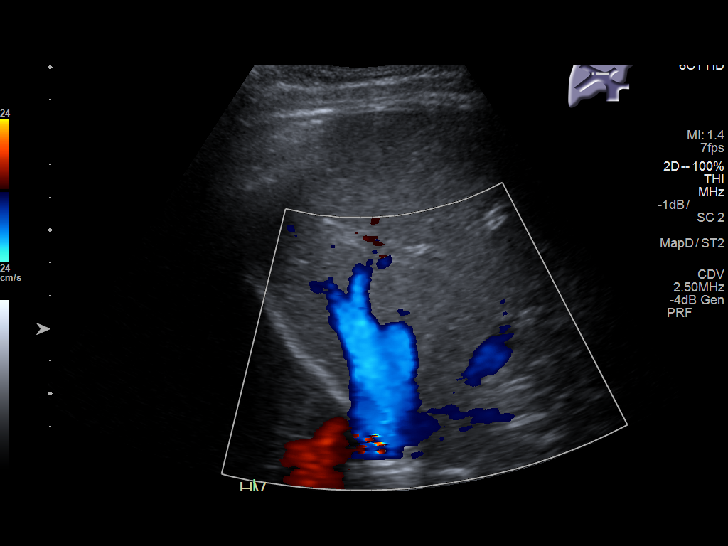
[im 52/96]
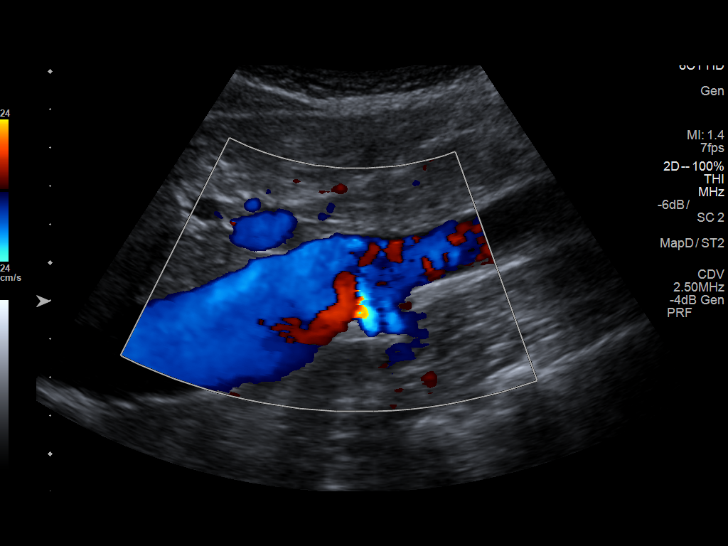
[im 60/96]
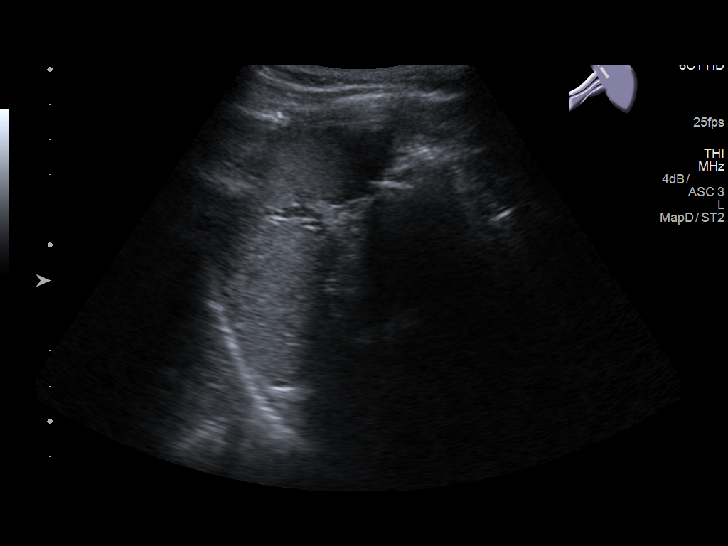
[im 64/96]
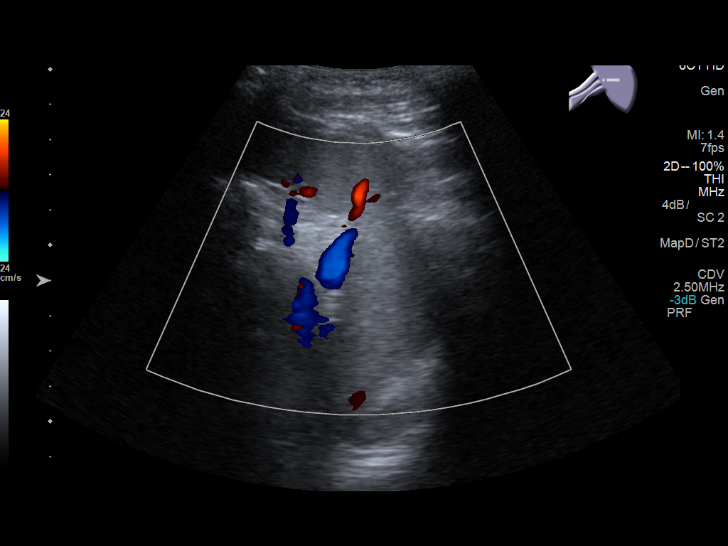
[im 72/96]
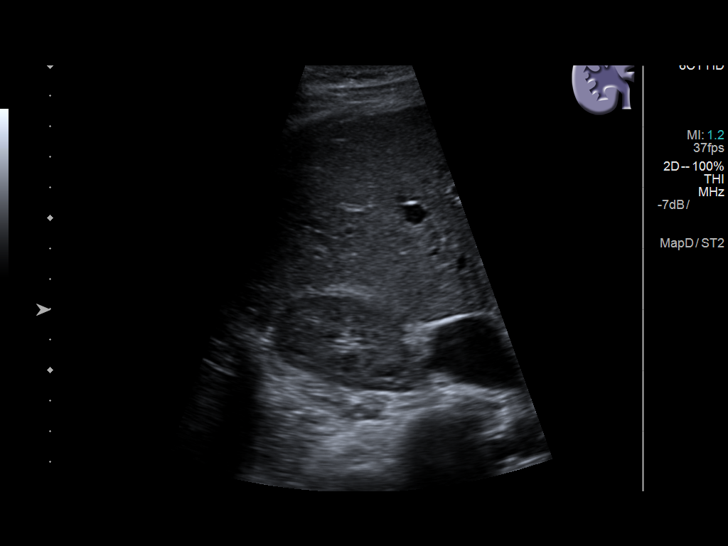
[im 80/96]
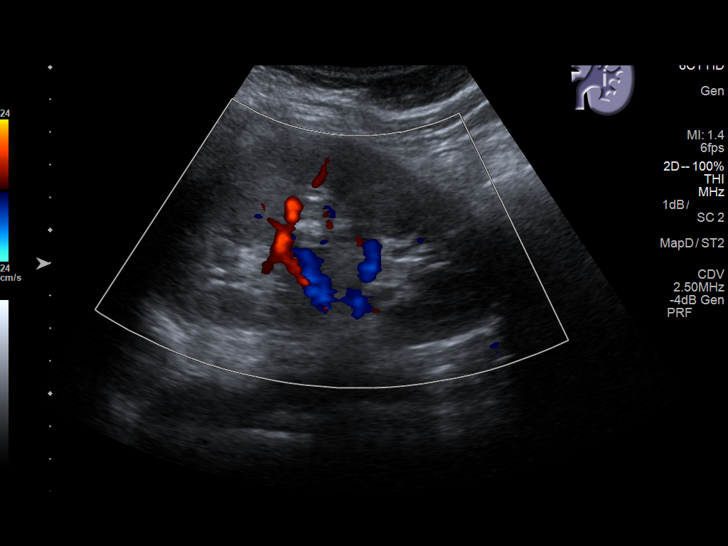
[im 88/96]
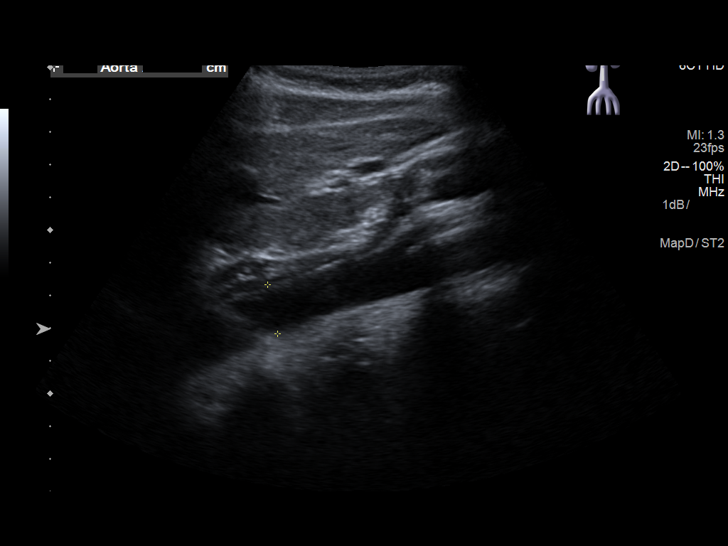
[im 96/96]
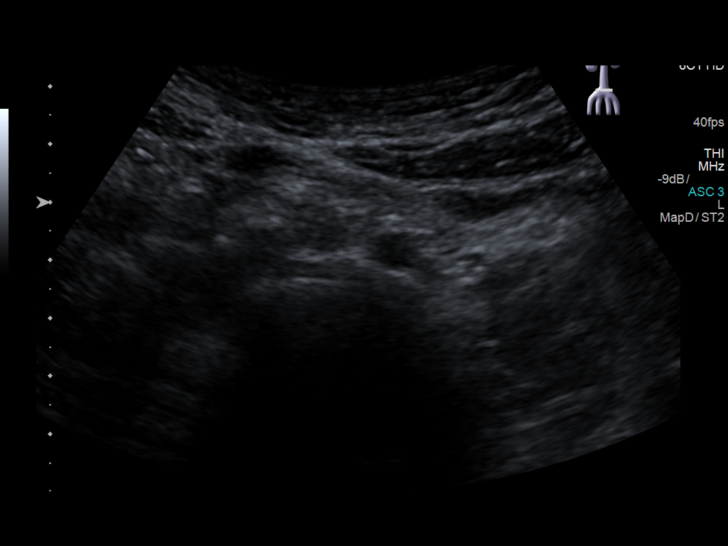

[14 of 25 positions shown; findings below may reference images not displayed]

FINDINGS: Gallbladder: No gallstones or wall thickening visualized. No
sonographic Murphy sign noted by sonographer.

Common bile duct: Diameter: 2.3 mm which is within normal limits.

Liver: No focal lesion identified. Within normal limits in
parenchymal echogenicity.

IVC: No abnormality visualized.

Pancreas: Visualized portion unremarkable.

Spleen: Size and appearance within normal limits.

Right Kidney: Length: 10.9 cm. Echogenicity within normal limits. No
mass or hydronephrosis visualized.

Left Kidney: Length: 10.8 cm. Echogenicity within normal limits. No
mass or hydronephrosis visualized.

Abdominal aorta: No aneurysm visualized.

Other findings: None.
IMPRESSION: No abnormality seen in the abdomen.

## 2019-08-04 ENCOUNTER — Encounter (INDEPENDENT_AMBULATORY_CARE_PROVIDER_SITE_OTHER): Payer: Self-pay
